# Patient Record
Sex: Male | Born: 1975 | Race: White | Hispanic: No | Marital: Married | Smoking: Never smoker
Health system: Southern US, Academic
[De-identification: ages and names within clinical notes are randomized; demographics above are authoritative.]

## PROBLEM LIST (undated history)

## (undated) DIAGNOSIS — J309 Allergic rhinitis, unspecified: Secondary | ICD-10-CM

## (undated) DIAGNOSIS — I10 Essential (primary) hypertension: Secondary | ICD-10-CM

## (undated) HISTORY — PX: OTHER SURGICAL HISTORY: SHX170

---
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
## 2022-03-05 IMAGING — MR MRI KNEE RT W/O CONTRAST
6 series · 40 of 40 positions shown · non-contrast
Comparison: None available.

﻿EXAM:  [id number]   MRI KNEE RT W/O CONTRAST
INDICATION: Right knee pain.
TECHNIQUE: Noncontrast multiplanar, multisequence MRI was performed.

[Series 4: s-map · axial · right · 7.8mm · 3.91mm/px · z∈[-187,-109]mm · 3 of 11 slices shown]
[im 1/11]
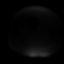
[im 6/11]
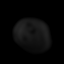
[im 11/11]
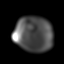

[Series 5: PD fat-sat · axial · right · 4.5mm · 0.37mm/px · z∈[-84,+41]mm · 7 of 26 slices shown (1 of 3)]
[im 1/26]
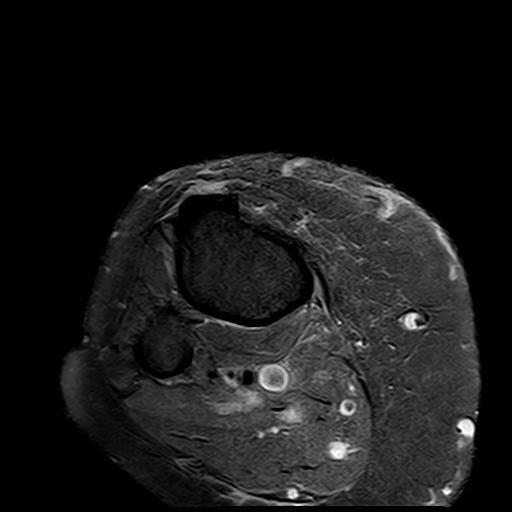
[im 5/26]
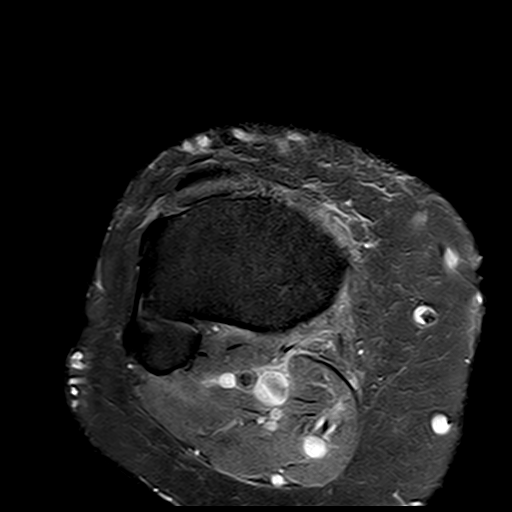
[im 9/26]
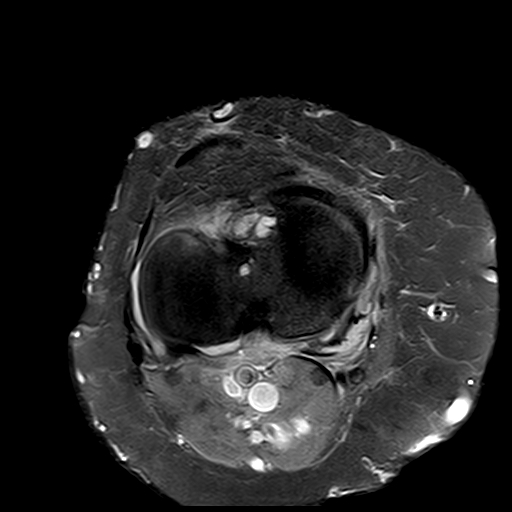
[im 13/26]
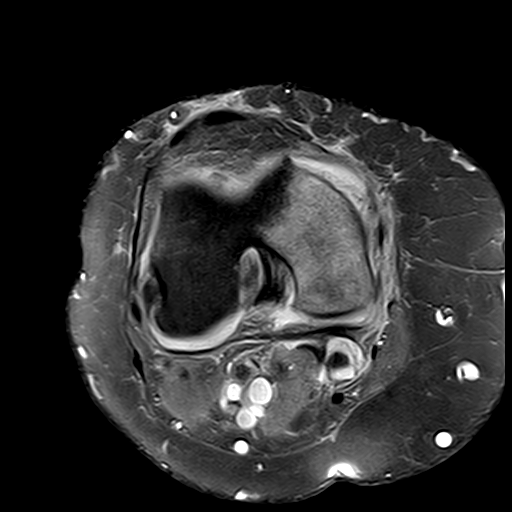
[im 17/26]
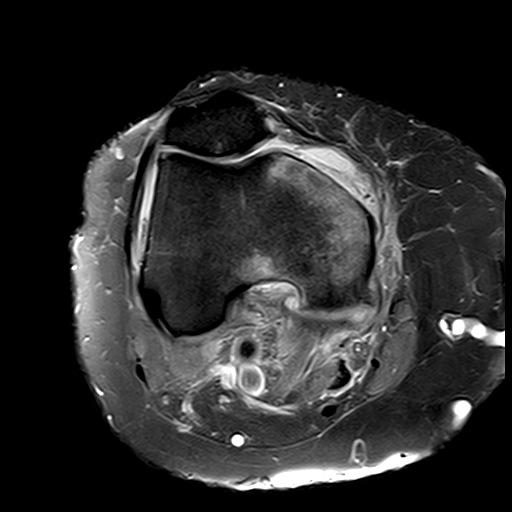
[im 21/26]
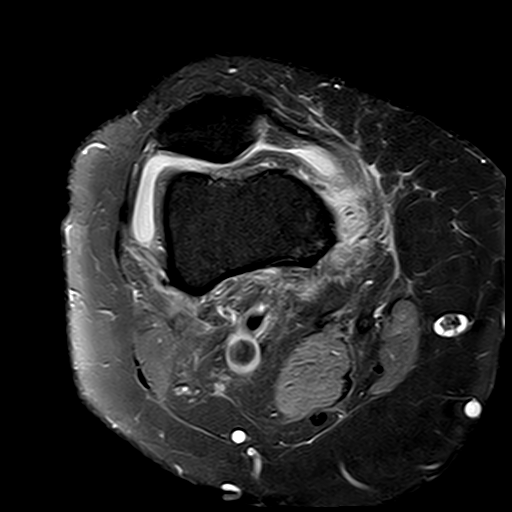
[im 26/26]
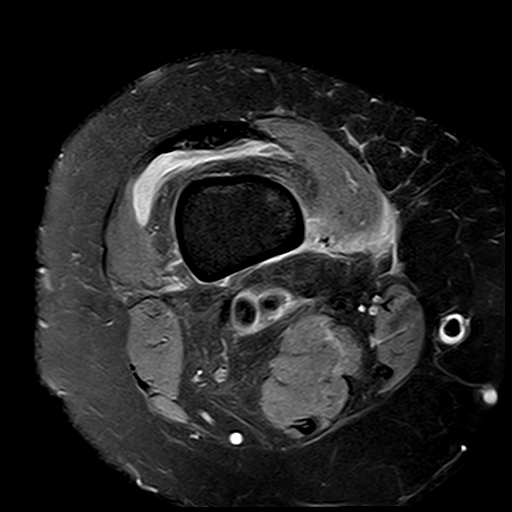

[Series 6: PD fat-sat · sagittal · right · 4.0mm · 0.53mm/px · 7 of 26 slices shown (2 of 3)]
[im 1/26]
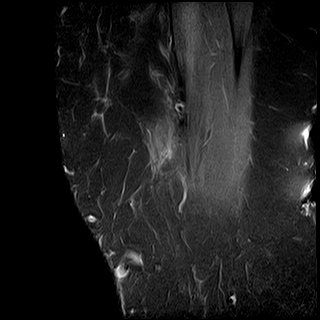
[im 5/26]
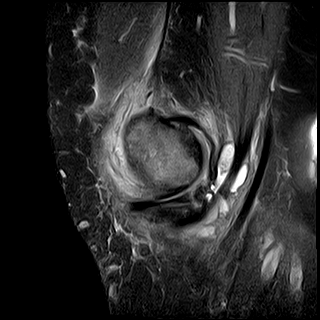
[im 9/26]
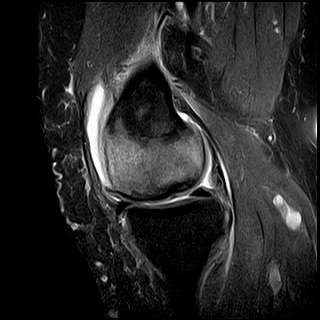
[im 13/26]
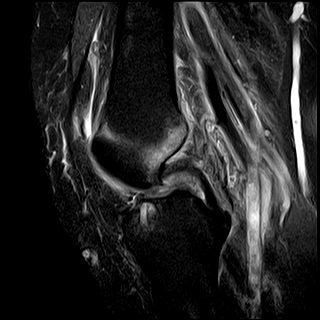
[im 17/26]
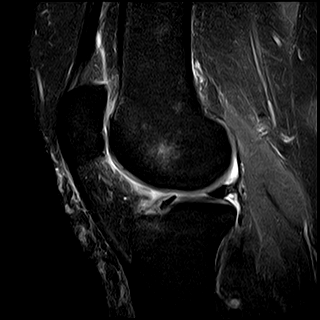
[im 21/26]
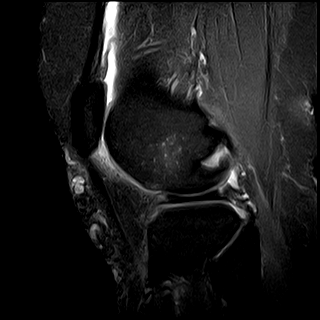
[im 26/26]
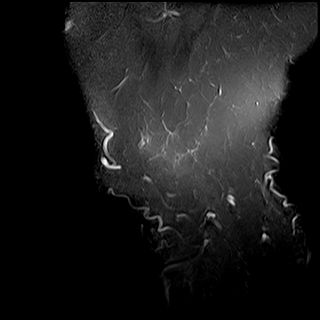

[Series 7: T1 · sagittal · right · 4.0mm · 0.53mm/px · 7 of 26 slices shown]
[im 1/26]
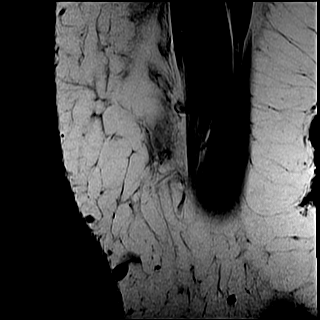
[im 5/26]
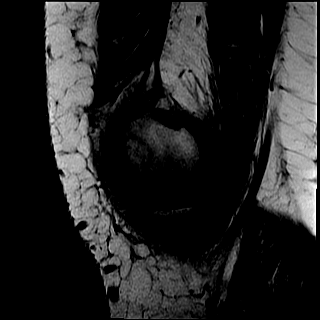
[im 9/26]
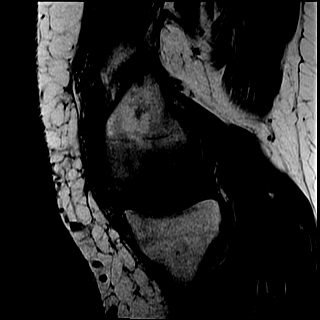
[im 13/26]
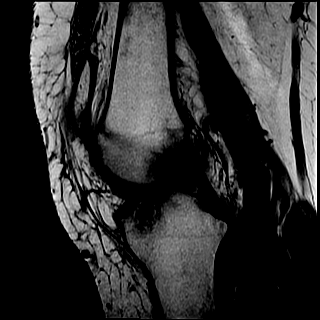
[im 17/26]
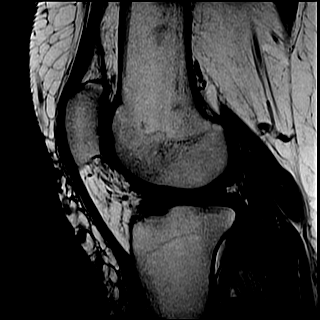
[im 21/26]
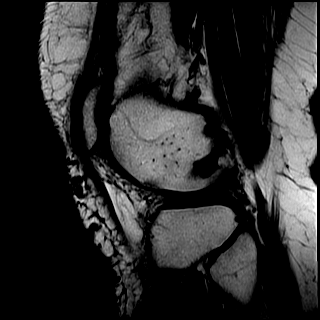
[im 26/26]
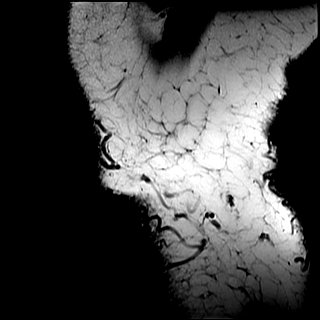

[Series 8: STIR · coronal · right · 4.0mm · 0.62mm/px · 8 of 30 slices shown]
[im 1/30]
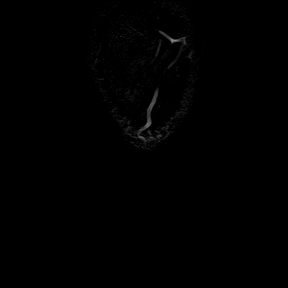
[im 5/30]
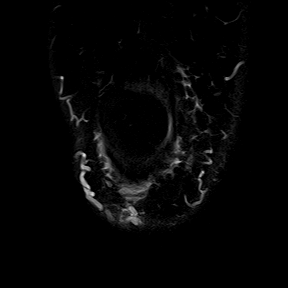
[im 9/30]
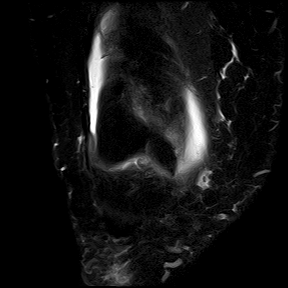
[im 13/30]
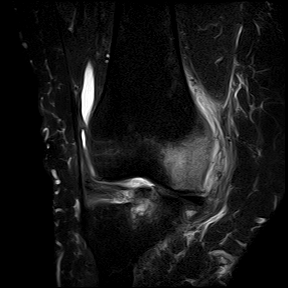
[im 17/30]
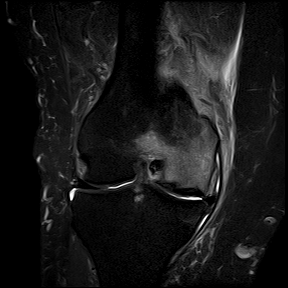
[im 21/30]
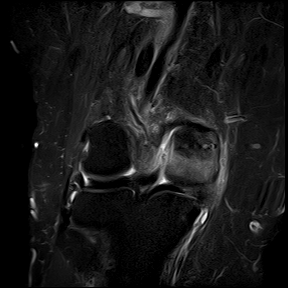
[im 25/30]
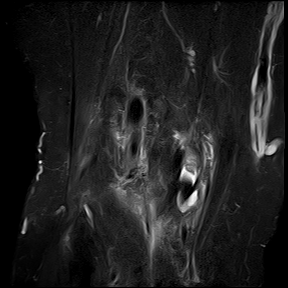
[im 30/30]
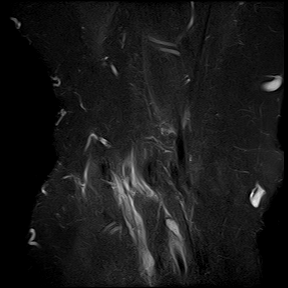

[Series 9: PD fat-sat · coronal · right · 4.0mm · 0.56mm/px · 8 of 30 slices shown (3 of 3)]
[im 1/30]
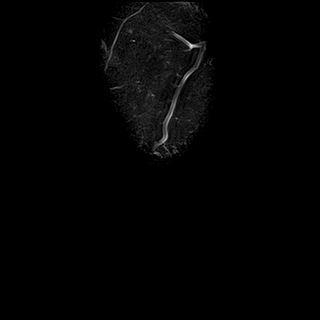
[im 5/30]
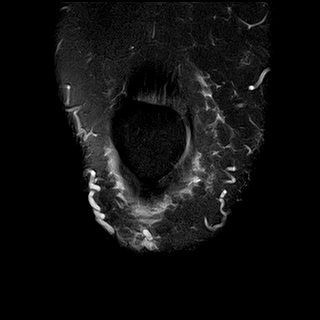
[im 9/30]
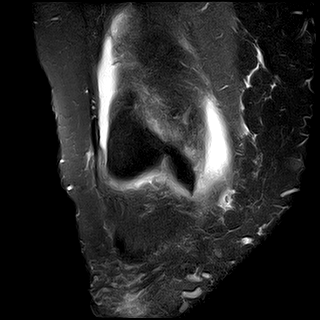
[im 13/30]
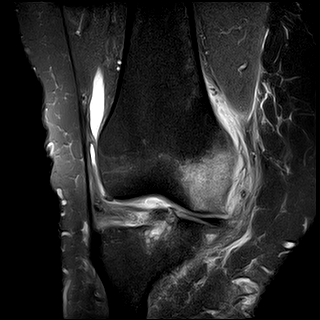
[im 17/30]
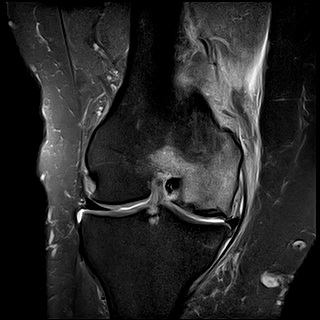
[im 21/30]
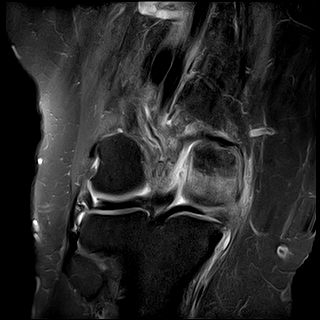
[im 25/30]
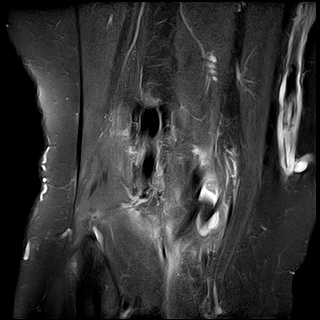
[im 30/30]
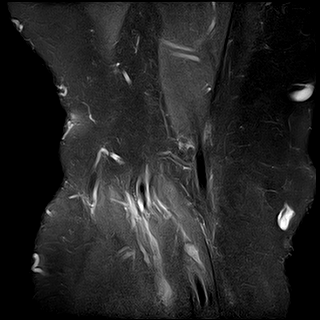

[40 of 40 positions shown; findings below may reference images not displayed]

FINDINGS: There is a small joint effusion.  

There is a large but shallow osteochondral fracture in the medial femoral condyle occupying much of the articular surface.  There is a large area of associated marrow edema in the iliofemoral condyle compatible with a large bone contusion.  

There is a partial tear of the medial collateral ligament with adjacent fluid and edema.  

There is a complex tear involving the posterior horn of the medial meniscus extending to the inferior articular surface.  

The lateral meniscus is intact.  The posterior cruciate ligament appears intact.  The anterior cruciate ligament is probably intact, although a partial tear cannot be entirely excluded.  The lateral collateral ligament and extensor tendons are also intact.  

Multiple cysts are noted in the tibial plateau adjacent to the anterior cruciate ligament insertion.
IMPRESSION: 1. Medial meniscal tear. 

2. Large osteochondral fracture with a large amount of associated marrow edema in the medial femoral condyle.  

3. Partial tear of the medial collateral ligament.

## 2022-04-24 IMAGING — MR MRI KNEE RT W/O CONTRAST
5 series · 36 of 40 positions shown · non-contrast
Comparison: MRI of the right knee dated 03/05/22.

﻿EXAM:  [id number]   MRI KNEE RT W/O CONTRAST
INDICATION: 46-year old male with abnormal findings on previous MRI examination.  Medial right knee pain.  No known history of trauma.  No history of surgery of the knee.
TECHNIQUE: Coronal, sagittal and axial images were obtained using C-spine coil due to body habitus.  Overall quality is acceptable.

[Series 5: PD fat-sat · axial · right · 4.5mm · 0.37mm/px · z∈[-57,+68]mm · 7 of 26 slices shown (1 of 3)]
[im 1/26]
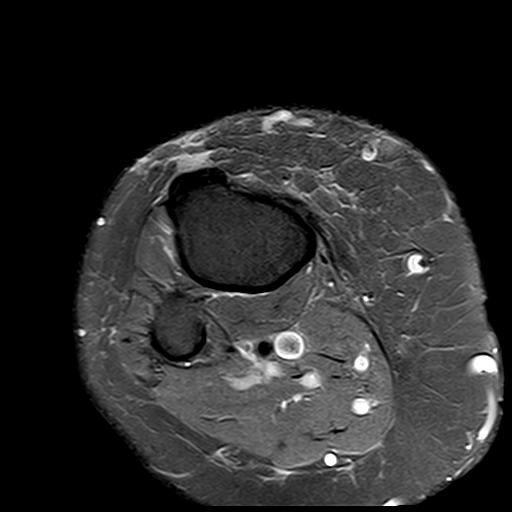
[im 5/26]
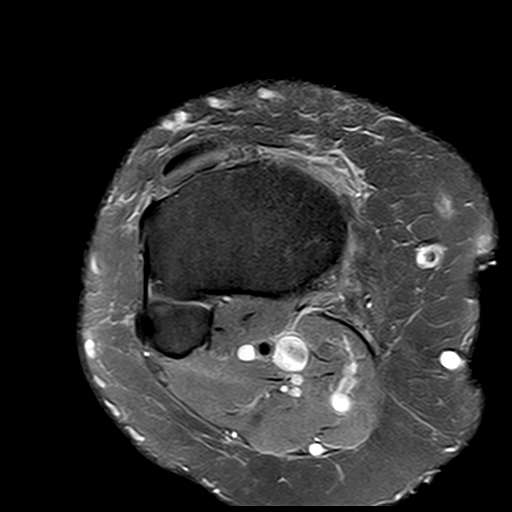
[im 9/26]
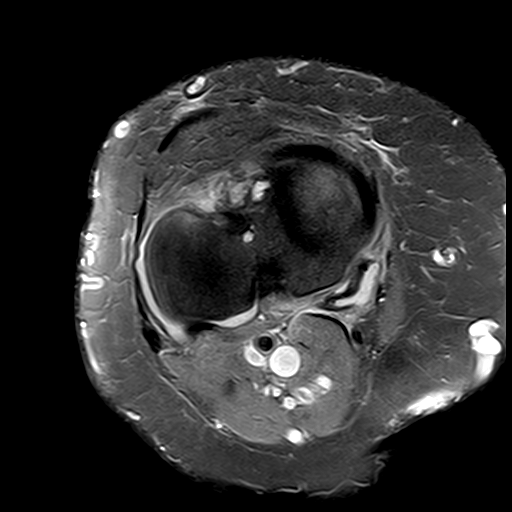
[im 13/26]
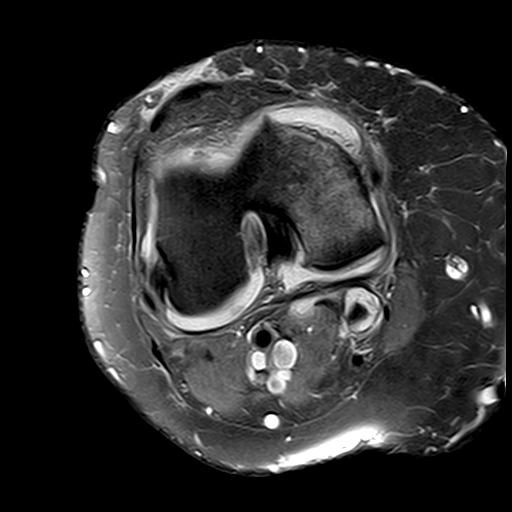
[im 17/26]
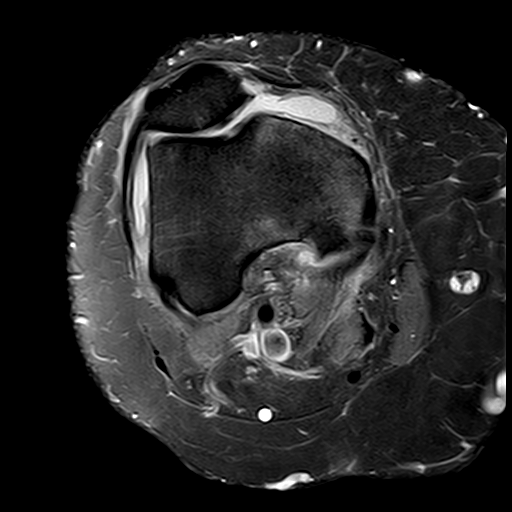
[im 21/26]
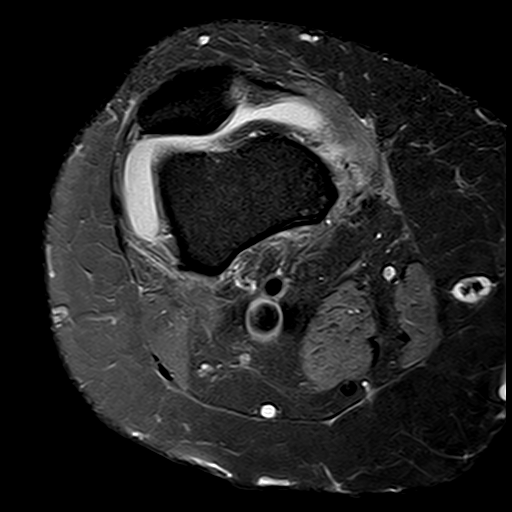
[im 26/26]
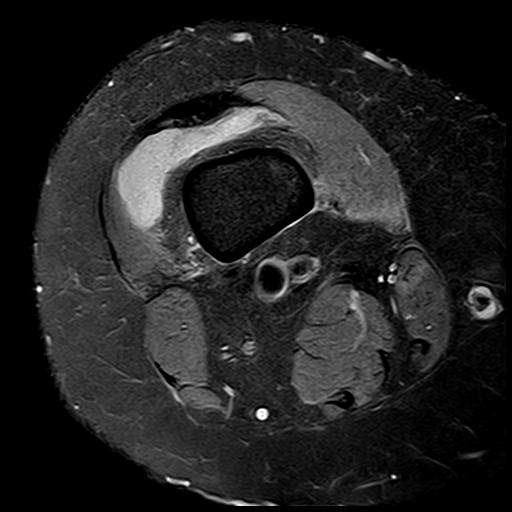

[Series 6: PD fat-sat · sagittal · right · 4.0mm · 0.59mm/px · 7 of 26 slices shown (2 of 3)]
[im 1/26]
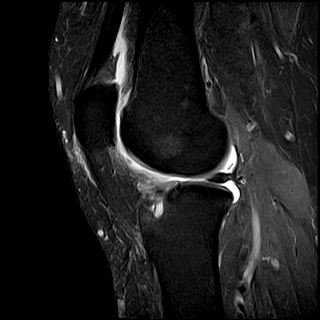
[im 5/26]
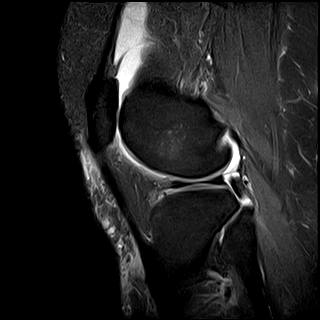
[im 9/26]
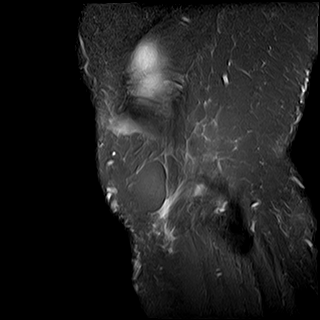
[im 13/26]
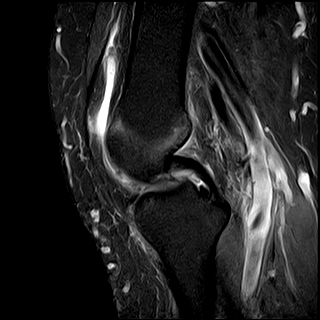
[im 17/26]
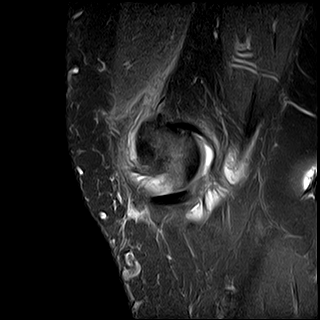
[im 21/26]
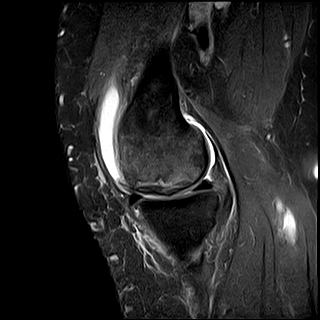
[im 26/26]
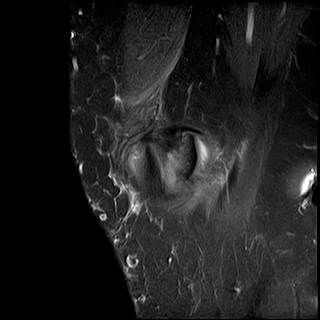

[Series 7: T1 · sagittal · right · 4.0mm · 0.59mm/px · 8 of 26 slices shown]
[im 1/26]
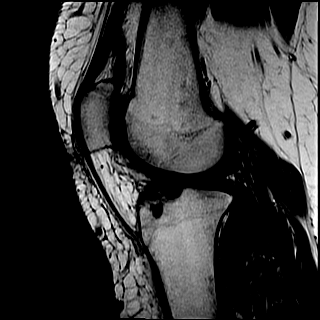
[im 4/26]
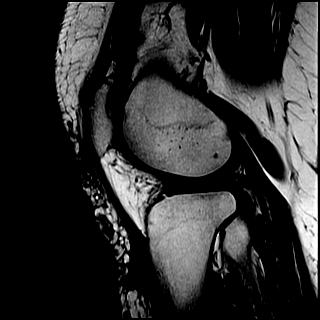
[im 8/26]
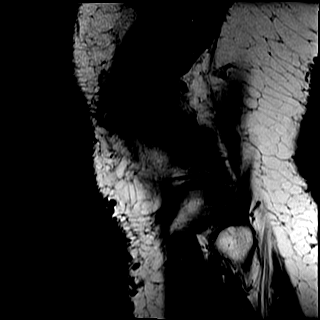
[im 11/26]
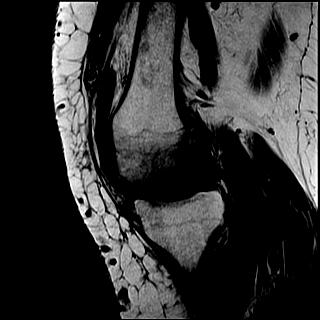
[im 15/26]
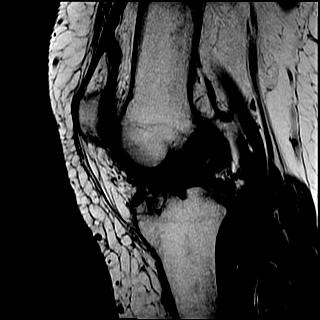
[im 18/26]
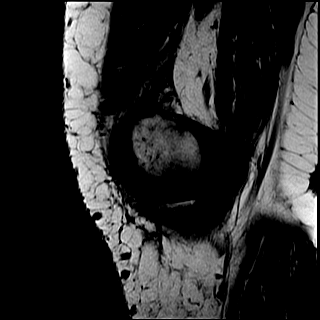
[im 22/26]
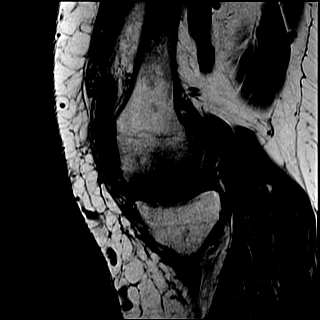
[im 26/26]
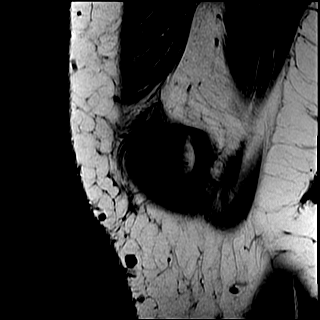

[Series 8: STIR · coronal · right · 4.0mm · 0.69mm/px · 5 of 30 slices shown]
[im 1/30]
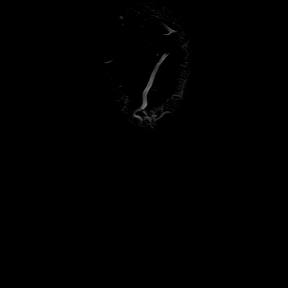
[im 4/30]
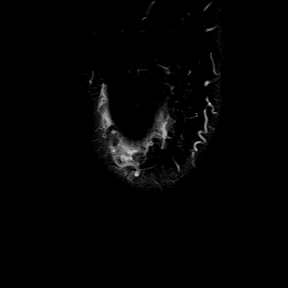
[im 8/30]
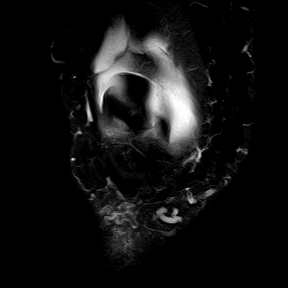
[im 11/30]
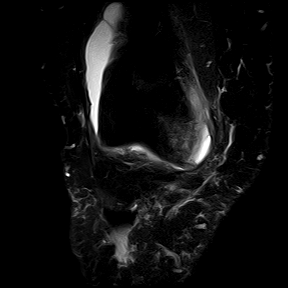
[im 19/30]
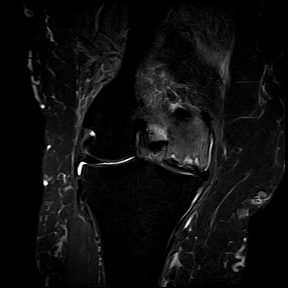

[Series 9: PD fat-sat · coronal · right · 4.0mm · 0.62mm/px · 9 of 30 slices shown (3 of 3)]
[im 1/30]
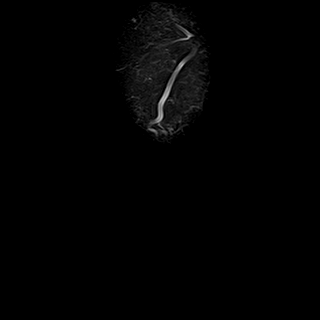
[im 4/30]
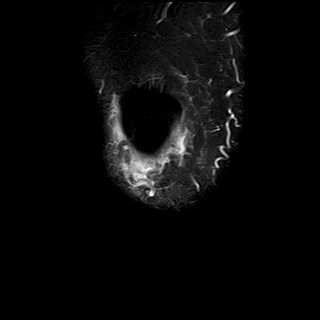
[im 8/30]
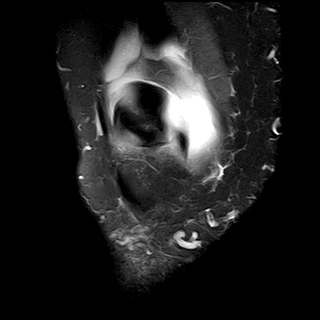
[im 11/30]
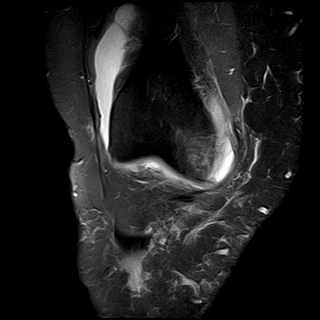
[im 15/30]
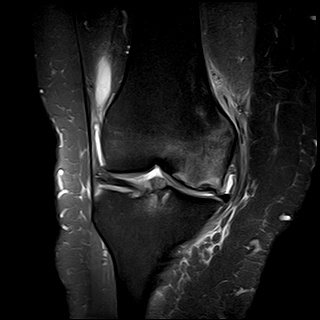
[im 19/30]
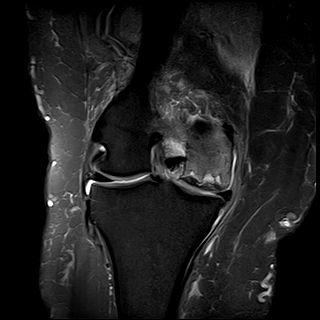
[im 22/30]
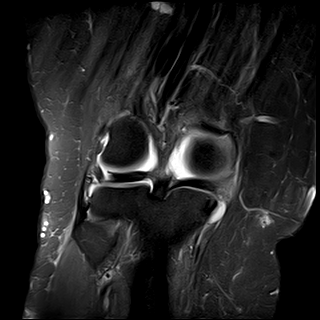
[im 26/30]
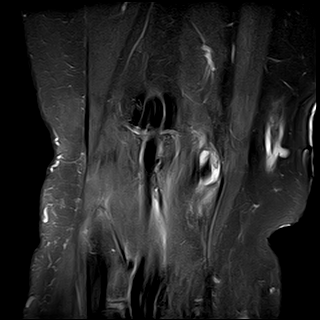
[im 30/30]
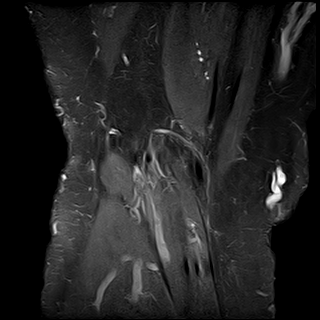

[36 of 40 positions shown; findings below may reference images not displayed]

FINDINGS: Lateral meniscus and lateral articular cartilage are relatively normal as well as lateral collateral ligament.  

Anterior and posterior cruciate ligaments are intact.  

Severe abnormality of medial femoral condyle is noted with severe Grade IV changes of osteochondritis dissecans with significant irregularity of the articular surface and subchondral bone marrow edema of the medial femoral condyle.  This finding is essentially unchanged from 03/05/22.  

Chronic irregular tear of the mid and posterior horn of medial meniscus similar to the previous examination.  Medial extrusion of mid medial meniscus is noted.  Medial collateral ligament is intact.  

Large effusion is noted in the knee joint along with a Baker’s cyst.  Varicose superficial veins are noted.
IMPRESSION: 1. Severe abnormality of medial femoral condyle with large area of osteochondritis dissecans and irregular articular surface of medial femoral condyle with subchondral bone marrow edema.  

2. Chronic irregular tear of posterior horn of medial meniscus with medial extrusion of mid medial meniscus.  

3. Large effusion in the knee joint.  Small Baker’s cyst.  Extensive varicose superficial veins.  

4. Overall findings of right knee are unchanged from 03/05/22.  

Electronically Signed by KNUTSEN, TAQUIZAS at [id number] [DATE]

## 2022-06-06 ENCOUNTER — Other Ambulatory Visit: Payer: BC Managed Care – PPO | Attending: Orthopaedic Surgery

## 2022-06-06 ENCOUNTER — Other Ambulatory Visit (HOSPITAL_COMMUNITY): Payer: Self-pay | Admitting: Orthopaedic Surgery

## 2022-06-06 ENCOUNTER — Inpatient Hospital Stay (HOSPITAL_BASED_OUTPATIENT_CLINIC_OR_DEPARTMENT_OTHER)
Admission: RE | Admit: 2022-06-06 | Discharge: 2022-06-06 | Disposition: A | Discharge status: Home / Self Care | Payer: BC Managed Care – PPO | Source: Ambulatory Visit | Attending: Orthopaedic Surgery | Admitting: Orthopaedic Surgery

## 2022-06-06 ENCOUNTER — Other Ambulatory Visit: Payer: Self-pay

## 2022-06-06 DIAGNOSIS — Z01818 Encounter for other preprocedural examination: Secondary | ICD-10-CM

## 2022-06-06 LAB — CBC WITH DIFF
BASOPHIL #: 0 10*3/uL (ref 0.00–0.10)
BASOPHIL %: 0 % (ref 0–1)
EOSINOPHIL #: 0.1 10*3/uL (ref 0.00–0.50)
EOSINOPHIL %: 1 %
HCT: 44.5 % (ref 36.7–47.1)
HGB: 15.3 g/dL (ref 12.5–16.3)
LYMPHOCYTE #: 2.1 10*3/uL (ref 1.00–3.00)
LYMPHOCYTE %: 28 % (ref 16–44)
MCH: 29.5 pg (ref 23.8–33.4)
MCHC: 34.5 g/dL (ref 32.5–36.3)
MCV: 85.7 fL (ref 73.0–96.2)
MONOCYTE #: 0.6 10*3/uL (ref 0.30–1.00)
MONOCYTE %: 7 % (ref 5–13)
MPV: 6.7 fL — ABNORMAL LOW (ref 7.4–11.4)
NEUTROPHIL #: 4.8 10*3/uL (ref 1.85–7.80)
NEUTROPHIL %: 63 % (ref 43–77)
PLATELETS: 213 10*3/uL (ref 140–440)
RBC: 5.19 10*6/uL (ref 4.06–5.63)
RDW: 13.7 % (ref 12.1–16.2)
WBC: 7.6 10*3/uL (ref 3.6–10.2)

## 2022-06-06 LAB — BASIC METABOLIC PANEL
ANION GAP: 4 mmol/L (ref 4–13)
BUN/CREA RATIO: 14 (ref 6–22)
BUN: 13 mg/dL (ref 7–25)
CALCIUM: 9.4 mg/dL (ref 8.6–10.3)
CHLORIDE: 105 mmol/L (ref 98–107)
CO2 TOTAL: 29 mmol/L (ref 21–31)
CREATININE: 0.95 mg/dL (ref 0.60–1.30)
ESTIMATED GFR: 99 mL/min/{1.73_m2} (ref >59)
GLUCOSE: 132 mg/dL — ABNORMAL HIGH (ref 74–109)
OSMOLALITY, CALCULATED: 278 mOsm/kg (ref 270–290)
POTASSIUM: 3.9 mmol/L (ref 3.5–5.1)
SODIUM: 138 mmol/L (ref 136–145)

## 2022-06-06 LAB — URINALYSIS, MICROSCOPIC
RBCS: <1 /hpf (ref <4)
WBCS: <1 /hpf (ref <6)

## 2022-06-06 LAB — URINALYSIS, MACROSCOPIC
BILIRUBIN: NEGATIVE mg/dL
BLOOD: NEGATIVE mg/dL
GLUCOSE: NEGATIVE mg/dL
KETONES: NEGATIVE mg/dL
LEUKOCYTES: NEGATIVE WBCs/uL
NITRITE: NEGATIVE
PH: 5.5 (ref 5.0–9.0)
PROTEIN: NEGATIVE mg/dL
SPECIFIC GRAVITY: 1.021 (ref 1.002–1.030)
UROBILINOGEN: NORMAL mg/dL

## 2022-06-06 LAB — ECG 12 LEAD
Atrial Rate: 68 {beats}/min
Calculated P Axis: 33 degrees
Calculated R Axis: 262 degrees
Calculated T Axis: 77 degrees
PR Interval: 170 ms
QRS Duration: 96 ms
QT Interval: 374 ms
QTC Calculation: 397 ms
Ventricular rate: 68 {beats}/min

## 2022-06-11 NOTE — H&P (Signed)
Cody Horton   [id number]     CHIEF COMPLAINT  Right Knee pain; 05/06/22; PJB     HISTORY  right knee pain, medial femoral condyle OCD: 47 year old male injured his right knee back in December with twisting type injury. Patient has had persistent pain since that time. He has an MRI which shows large area of the medial femoral condyle OCD with some subchondral fluid as well as a large medial meniscus tear. Patient has been treated with protected weightbearing modification activity. Pain continues. Has intermittent catching and locking in the knee. Recent MRI is reviewed showing large area of OCD medial femoral condyle with some subchondral     PAST MEDICAL HISTORY  Denies history of cancer Cardiac: Hypertension GI history: Ulcers Treatment: N/A By: Musculoskeletal: Denies OA, gout and other common musculoskeletal problems. Rheumatologic: Denies N/A     PAST SURGICAL HISTORY  Date Side/ Site Procedure Details Doctor Facility Complications 03/17/75 Tonsillectomy     ALLERGIES  Name Reactions Notes codeine unknown     MEDICATIONS  Name Tasprin 81 mg 30 oral 1 po daily 2022-03-17 ------Platelet Aggregation Inhibitors - Salicylates IB Pro 800 mg 30 oral 1 po bid 2022-03-17 ------NSAID Analgesics ([facility] Non-Specific) - Propionic Acid Derivatives ------INTERACTIONS: Drug-drug interaction to Tasprin; Micardis 40 mg 30 oral 1 po daily 2022-03-17 ------Angiotensin II Receptor Blockers (ARBs) ------INTERACTIONS: Drug-drug interaction to IB Pro; Strength QTY Sig Date     Family and Social History  Patient is Married. Smoking: Current every day smoker. * pckyr Recode: 1 ETOH: None Lives with family Mother age 49; Father: age 38 1 brothers; No sisters. Diabetes in father, Emelia Loron and Grandmother. Hypertension in mother, father, brother and Grandfather MI in [place] Cholesterol father and Grandfather History of Blood clots in N/A, Grandfather, No known family history of Stroke, Bleeding, Neurologic conditions     REVIEW OF  SYSTEMS  General: No history of change in weight, fever, chills, change in energy, fatigue or changes in sleep habits. Opthalmic: Negative for change in vision / eye problems. ENT: Denies congestion, sinus, throat and hearing issues. Respiratory: Denies SOB, cough and other respiratory symptoms. Cardiac: Denies chest pain, CHF and other cardiac symptoms. GI: No changes in bowel habits, abdominal pain or other GI complaint. Male GU: Denies significant male genitourinary symptoms. Musculoskeletal: Denies significant pain, joint or muscular symptoms. Neurological: Denies cephalgia, seizures, weakness, numbness and other neurologic symptoms. Endocrine: Denies polyuria, polydypsia and other endocrine related complaints. Behavioral: Denies significant mood and psychological symptoms. Hemeatologic: Denies anemia, bleeding / clotting problems and other blood related complaints. Stop Bang interpretation / modified: Low probability of OSA: Score 2 RCRI, no risk factors, risk of cardiac death, nonfatal MI and cardiac arrest, 0.4%. Risk of MI, Pulmonary edema, ventricular fib, cardiac arrest and complete heart block 0.5% POUR / IPSS score 0. Score classified as mild level of symptoms, with no or mild increase in risk of urinary retention. ASA II : Mild systemic disease Charlson score: Enter PMFSH then select calculate score resulting in estimated in hospital mortality rate of 1.2%     EXAM  BMI 45.62; Temp: ; HR ; BP /; H: 6\' 3" ; Wt 365lb pO2 / RA. Ideal body wt below 200.04 lb BMI 30 240.04lb BMI 35 280.05 lb   BMI 39 312.06 lb. Reviewed BMI and local resources for weight management. At BMI level, supervised plan or bariatric management may be needed.  Right knee exam: Small effusion tenderness medial femoral condyle medial joint line McMurray positive. Pedal pulses palpable  no pain with hip rotation. General exam   BMI   Cardiac, respiratory and mental status NORMAL     X-RAYS  Date Name Review 04/24/2022 R Knee MR Medial  meniscus: complex tear. Osteochondritis dessicans medial femoral condyle. Bone edema medial femoral condyle. Large area of OCD, mild joint surface irregularity, fluid signal under 12mm of the OCD lesion., Cysts posterior tibia under tibial spine, 1.3 cm. 1. Severe abnormality of medial femoral condyle with large area of osteochondritis dissecans and irregular articular surface of medial femoral condyle with subchondral bone marrow edema. 2. Chronic irregular tear of posterior horn of medial meniscus with medial extrusion of mid medial meniscus. 3. Large effusion in the knee joint. Small Baker's cyst. Extensive varicose superficial veins. 4. Overall findings of right knee are unchanged from 03/05/22. 03/17/2022 Bil KN XR AP STAND Right knee arthritis: Primary;KLstage3- moderate. Compartments: medial. <50% medial joint space narrowing. 03/05/2022 R Knee MR Medial meniscus: complex tear. Osteochondritis dessicans medial femoral condyle. Bone edema medial femoral condyle. Review of outside study & report     Other Treatments reviewed     ASSESSMENT  osteochondral defect right knee medial meniscal tear Significant bone contusion     Diagnosis  osteochondral defect right knee medial meniscal tear significant bone contusion  PMFSH Diagnosis Z68.42: Body mass index (BMI) 45.0-49.9, adult E66.01: Morbid (severe) obesity due to excess calories     RECOMMENDATION  Treatment: Detailed discussion about OCD and medial meniscus tear. Due to failing to improve, and no change on interval MRI, recommend proceeding with arthroscopy. Arthroscopy will have purpose of treating the medial men iscus tear with excision which may improve some of the symptoms. OCD will be evaluated for stability may consider subchondral bone grafting which was done by either close open technique either using resorbable screws or metal screws which would need to be removed at a later date. If bone graft is required we will typically harvest that locally from  the femoral epicondyles. we discussed the expected postoperative course that in some cases this may require arthroplasty as definitive treatment. We 3  discussed the risks of infection, and spectrum of possible outcomes that the knee will not be normal afterwards and that weight reduction will be critical portion of the treatment in any case. Informed consent is obtained.  Orders:   Date Name Review 04/24/2022 R Knee MR Medial meniscus: complex tear. Osteochondritis dessicans medial femoral condyle. Bone edema medial femoral condyle. Large area of OCD, mild joint surface irregularity, fluid signal under 12mm of the OCD lesion., Cysts posterior tibia under tibial spine, 1.3 cm. 1. Severe abnormality of medial femoral condyle with large area of osteochondritis dissecans and irregular articular surface of medial femoral condyle with subchondral bone marrow edema. 2. Chronic irregular tear of posterior horn of medial meniscus with medial extrusion of mid medial meniscus. 3. Large effusion in the knee joint. Small Baker's cyst. Extensive varicose superficial veins. 4. Overall findings of right knee are unchanged from 03/05/22. 03/17/2022 Bil KN XR AP STAND Right knee arthritis: Primary;KLstage3- moderate. Compartments: medial. <50% medial joint space narrowing. 03/05/2022 R Knee MR Medial meniscus: complex tear. Osteochondritis dessicans medial femoral condyle. Bone edema medial femoral condyle. Review of outside study & report     RETURN / FOLLOW-UP  Right knee arthroscopy scheduled     Alexia Freestone, MD,FAAOS   Attestation:  Patient has been examined before anesthesia, and there are no changes in medical status or indications for surgery.

## 2022-06-12 ENCOUNTER — Inpatient Hospital Stay
Admission: RE | Admit: 2022-06-12 | Discharge: 2022-06-12 | Disposition: A | Discharge status: Home / Self Care | Payer: BC Managed Care – PPO | Attending: Orthopaedic Surgery | Admitting: Orthopaedic Surgery

## 2022-06-12 ENCOUNTER — Encounter (HOSPITAL_COMMUNITY)
Admission: RE | Disposition: A | Discharge status: Home / Self Care | Payer: Self-pay | Source: Home / Self Care | Attending: Orthopaedic Surgery

## 2022-06-12 ENCOUNTER — Ambulatory Visit (HOSPITAL_COMMUNITY): Payer: BC Managed Care – PPO | Admitting: Certified Registered"

## 2022-06-12 ENCOUNTER — Encounter (HOSPITAL_COMMUNITY): Payer: BC Managed Care – PPO | Admitting: Orthopaedic Surgery

## 2022-06-12 ENCOUNTER — Encounter (HOSPITAL_COMMUNITY): Payer: Self-pay | Admitting: Orthopaedic Surgery

## 2022-06-12 ENCOUNTER — Other Ambulatory Visit: Payer: Self-pay

## 2022-06-12 DIAGNOSIS — I10 Essential (primary) hypertension: Secondary | ICD-10-CM | POA: Insufficient documentation

## 2022-06-12 DIAGNOSIS — M93261 Osteochondritis dissecans, right knee: Secondary | ICD-10-CM | POA: Insufficient documentation

## 2022-06-12 DIAGNOSIS — Z6841 Body Mass Index (BMI) 40.0 and over, adult: Secondary | ICD-10-CM | POA: Insufficient documentation

## 2022-06-12 DIAGNOSIS — S83231A Complex tear of medial meniscus, current injury, right knee, initial encounter: Secondary | ICD-10-CM | POA: Insufficient documentation

## 2022-06-12 HISTORY — DX: Allergic rhinitis, unspecified: J30.9

## 2022-06-12 HISTORY — DX: Essential (primary) hypertension: I10

## 2022-06-12 SURGERY — ARTHROSCOPY KNEE WITH MENISCAL REPAIR
Anesthesia: General | Site: Knee | Laterality: Right | Wound class: Clean Wound: Uninfected operative wounds in which no inflammation occurred

## 2022-06-12 MED ORDER — CEFAZOLIN 1 GRAM SOLUTION FOR INJECTION
INTRAMUSCULAR | Status: AC
Start: 2022-06-12 — End: 2022-06-12
  Filled 2022-06-12: qty 10

## 2022-06-12 MED ORDER — SODIUM CHLORIDE 0.9 % (FLUSH) INJECTION SYRINGE
3.0000 mL | INJECTION | Freq: Three times a day (TID) | INTRAMUSCULAR | Status: DC
Start: 2022-06-12 — End: 2022-06-12

## 2022-06-12 MED ORDER — SUCCINYLCHOLINE 20 MG/ML INTRAVENOUS WRAPPER
INJECTION | Freq: Once | INTRAVENOUS | Status: DC | PRN
Start: 2022-06-12 — End: 2022-06-12
  Administered 2022-06-12: 140 mg via INTRAVENOUS

## 2022-06-12 MED ORDER — IPRATROPIUM 0.5 MG-ALBUTEROL 3 MG (2.5 MG BASE)/3 ML NEBULIZATION SOLN
3.0000 mL | INHALATION_SOLUTION | Freq: Once | RESPIRATORY_TRACT | Status: DC | PRN
Start: 2022-06-12 — End: 2022-06-12

## 2022-06-12 MED ORDER — LACTATED RINGERS INTRAVENOUS SOLUTION
INTRAVENOUS | Status: DC
Start: 2022-06-12 — End: 2022-06-12

## 2022-06-12 MED ORDER — MIDAZOLAM 5 MG/ML INJECTION WRAPPER
2.0000 mg | Freq: Once | INTRAMUSCULAR | Status: DC | PRN
Start: 2022-06-12 — End: 2022-06-12
  Administered 2022-06-12: 2 mg via INTRAVENOUS

## 2022-06-12 MED ORDER — HYDROMORPHONE 2 MG/ML INJECTION WRAPPER
0.5000 mg | INJECTION | Freq: Once | INTRAMUSCULAR | Status: DC | PRN
Start: 2022-06-12 — End: 2022-06-12

## 2022-06-12 MED ORDER — DEXAMETHASONE SODIUM PHOSPHATE 4 MG/ML INJECTION SOLUTION
INTRAMUSCULAR | Status: AC
Start: 2022-06-12 — End: 2022-06-12
  Filled 2022-06-12: qty 1

## 2022-06-12 MED ORDER — ONDANSETRON HCL (PF) 4 MG/2 ML INJECTION SOLUTION
INTRAMUSCULAR | Status: AC
Start: 2022-06-12 — End: 2022-06-12
  Filled 2022-06-12: qty 2

## 2022-06-12 MED ORDER — ALBUTEROL SULFATE 2.5 MG/3 ML (0.083 %) SOLUTION FOR NEBULIZATION
2.5000 mg | INHALATION_SOLUTION | Freq: Once | RESPIRATORY_TRACT | Status: DC | PRN
Start: 2022-06-12 — End: 2022-06-12

## 2022-06-12 MED ORDER — LIDOCAINE (PF) 100 MG/5 ML (2 %) INTRAVENOUS SYRINGE
INJECTION | Freq: Once | INTRAVENOUS | Status: DC | PRN
Start: 2022-06-12 — End: 2022-06-12
  Administered 2022-06-12: 100 mg via INTRAVENOUS

## 2022-06-12 MED ORDER — DEXAMETHASONE SODIUM PHOSPHATE 4 MG/ML INJECTION SOLUTION
4.0000 mg | Freq: Once | INTRAMUSCULAR | Status: AC
Start: 2022-06-12 — End: 2022-06-12
  Administered 2022-06-12: 4 mg via INTRAVENOUS

## 2022-06-12 MED ORDER — PROCHLORPERAZINE EDISYLATE 10 MG/2 ML (5 MG/ML) INJECTION SOLUTION
5.0000 mg | Freq: Once | INTRAMUSCULAR | Status: DC | PRN
Start: 2022-06-12 — End: 2022-06-12

## 2022-06-12 MED ORDER — ONDANSETRON HCL (PF) 4 MG/2 ML INJECTION SOLUTION
4.0000 mg | Freq: Once | INTRAMUSCULAR | Status: AC
Start: 2022-06-12 — End: 2022-06-12
  Administered 2022-06-12: 4 mg via INTRAVENOUS

## 2022-06-12 MED ORDER — SUGAMMADEX 100 MG/ML INTRAVENOUS SOLUTION
Freq: Once | INTRAVENOUS | Status: DC | PRN
Start: 2022-06-12 — End: 2022-06-12
  Administered 2022-06-12: 500 mg via INTRAVENOUS

## 2022-06-12 MED ORDER — FENTANYL (PF) 50 MCG/ML INJECTION SOLUTION
INTRAMUSCULAR | Status: AC
Start: 2022-06-12 — End: 2022-06-12
  Filled 2022-06-12: qty 2

## 2022-06-12 MED ORDER — MORPHINE 10 MG/ML INJECTION WRAPPER
INTRAVENOUS | Status: AC
Start: 2022-06-12 — End: 2022-06-12
  Filled 2022-06-12: qty 1

## 2022-06-12 MED ORDER — PROPOFOL 10 MG/ML IV BOLUS
INJECTION | Freq: Once | INTRAVENOUS | Status: DC | PRN
Start: 2022-06-12 — End: 2022-06-12
  Administered 2022-06-12: 200 mg via INTRAVENOUS

## 2022-06-12 MED ORDER — SODIUM CHLORIDE 0.9 % (FLUSH) INJECTION SYRINGE
3.0000 mL | INJECTION | INTRAMUSCULAR | Status: DC | PRN
Start: 2022-06-12 — End: 2022-06-12

## 2022-06-12 MED ORDER — MIDAZOLAM 5 MG/ML INJECTION WRAPPER
INTRAMUSCULAR | Status: AC
Start: 2022-06-12 — End: 2022-06-12
  Filled 2022-06-12: qty 1

## 2022-06-12 MED ORDER — ONDANSETRON HCL (PF) 4 MG/2 ML INJECTION SOLUTION
4.0000 mg | Freq: Once | INTRAMUSCULAR | Status: DC | PRN
Start: 2022-06-12 — End: 2022-06-12

## 2022-06-12 MED ORDER — SODIUM CHLORIDE 0.9 % INTRAVENOUS PIGGYBACK
INJECTION | INTRAVENOUS | Status: AC
Start: 2022-06-12 — End: 2022-06-12
  Filled 2022-06-12: qty 50

## 2022-06-12 MED ORDER — FENTANYL (PF) 50 MCG/ML INJECTION WRAPPER
50.0000 ug | INJECTION | INTRAMUSCULAR | Status: DC | PRN
Start: 2022-06-12 — End: 2022-06-12
  Administered 2022-06-12 (×2): 50 ug via INTRAVENOUS

## 2022-06-12 MED ORDER — FAMOTIDINE (PF) 20 MG/2 ML INTRAVENOUS SOLUTION
INTRAVENOUS | Status: AC
Start: 2022-06-12 — End: 2022-06-12
  Filled 2022-06-12: qty 2

## 2022-06-12 MED ORDER — HYDROCODONE 7.5 MG-ACETAMINOPHEN 325 MG TABLET
1.0000 | ORAL_TABLET | Freq: Four times a day (QID) | ORAL | 0 refills | Status: AC | PRN
Start: 2022-06-12 — End: ?

## 2022-06-12 MED ORDER — ROPIVACAINE (PF) 2 MG/ML (0.2 %) INJECTION SOLUTION
INTRAMUSCULAR | Status: AC
Start: 2022-06-12 — End: 2022-06-12
  Filled 2022-06-12: qty 10

## 2022-06-12 MED ORDER — ROCURONIUM 10 MG/ML INTRAVENOUS SOLUTION
Freq: Once | INTRAVENOUS | Status: DC | PRN
Start: 2022-06-12 — End: 2022-06-12
  Administered 2022-06-12: 5 mg via INTRAVENOUS
  Administered 2022-06-12: 45 mg via INTRAVENOUS

## 2022-06-12 MED ORDER — ROPIVACAINE (PF) 2 MG/ML (0.2 %) INJECTION SOLUTION
Freq: Once | INTRAMUSCULAR | Status: DC | PRN
Start: 2022-06-12 — End: 2022-06-12

## 2022-06-12 MED ORDER — FAMOTIDINE (PF) 20 MG/2 ML INTRAVENOUS SOLUTION
20.0000 mg | Freq: Once | INTRAVENOUS | Status: AC
Start: 2022-06-12 — End: 2022-06-12
  Administered 2022-06-12: 20 mg via INTRAVENOUS

## 2022-06-12 MED ORDER — CEFAZOLIN 1 GRAM SOLUTION FOR INJECTION
Freq: Once | INTRAMUSCULAR | Status: DC | PRN
Start: 2022-06-12 — End: 2022-06-12
  Administered 2022-06-12: 3000 mg via INTRAVENOUS

## 2022-06-12 MED ORDER — FENTANYL (PF) 50 MCG/ML INJECTION WRAPPER
INJECTION | Freq: Once | INTRAMUSCULAR | Status: DC | PRN
Start: 2022-06-12 — End: 2022-06-12
  Administered 2022-06-12 (×2): 50 ug via INTRAVENOUS

## 2022-06-12 MED ORDER — OXYCODONE-ACETAMINOPHEN 5 MG-325 MG TABLET
1.0000 | ORAL_TABLET | Freq: Once | ORAL | Status: DC | PRN
Start: 2022-06-12 — End: 2022-06-12
  Administered 2022-06-12: 1 via ORAL
  Filled 2022-06-12: qty 1

## 2022-06-12 MED ORDER — LACTATED RINGERS INTRAVENOUS SOLUTION
INTRAVENOUS | Status: DC | PRN
Start: 2022-06-12 — End: 2022-06-12
  Administered 2022-06-12: 0 via INTRAVENOUS

## 2022-06-12 SURGICAL SUPPLY — 70 items
BANDAGE ELT 5.8YDX4IN NONST SLFCLS ELAS KNIT TEAL END STCH VELCRO COTTON POLY BLND STD LGTH COMPRESS (WOUND CARE SUPPLY) ×1 IMPLANT
BANDAGE ELT 5.8YDX6IN NONST SLFCLS ELAS KNIT STRCH VELCRO COTTON POLY BLND STD LGTH COMPRESS BGE HNY (WOUND CARE SUPPLY) ×1 IMPLANT
BANDAGE ESMARK 9FTX6IN STRL SYN COMPRESS LF (WOUND CARE SUPPLY) ×1 IMPLANT
BLADE 10 2 END CBNSTL SURG STRL DISP (SURGICAL CUTTING SUPPLIES) ×3 IMPLANT
BLADE 10MM SURG STRL PAR GRAFT KNIFE (SURGICAL CUTTING SUPPLIES) ×1 IMPLANT
BLADE 11 2 END CBNSTL SURG STRL DISP (SURGICAL CUTTING SUPPLIES) ×1 IMPLANT
BLADE 15 2 END CBNSTL SURG STRL DISP (SURGICAL CUTTING SUPPLIES) ×1 IMPLANT
BLADE SAW 24.5X9MM SGTL SS THK.64MM XSH NRW THN STRL LF (SURGICAL CUTTING SUPPLIES) ×1 IMPLANT
BLADE SAW 73X25MM 2 CUT SGTL SS THK.89MM MED W STRL LF (SURGICAL CUTTING SUPPLIES) ×1 IMPLANT
BLADE SHAVER 13CM 4MM COOLCUT 2 CUT POWER SFT TISS RESCT STRL DISP (ENDOSCOPIC SUPPLIES) ×1 IMPLANT
BLADE SURG CLPR W 37.2MM GP EXIST HNDL GTT IN CHRG .23MM NONST LF  DISP (MED SURG SUPPLIES) IMPLANT
BURR SHAVER 13CM 4MM COOLCUT 8 FLUTE OVAL STRL DISP (ENDOSCOPIC SUPPLIES) IMPLANT
BURR SHAVER 13CM 4MM COOLCUT 8 FLUTE RND STRL DISP (ENDOSCOPIC SUPPLIES) ×1 IMPLANT
CONV USE ITEM 338662 - PACK SURG ASCP STRL DISP ~~LOC~~ BPT MED CNTR LF (CUSTOM TRAYS & PACK) ×1 IMPLANT
COUNTER 20 CNT BLOCK ADH NEEDLE STRL LF  RD SHARP FOAM 15.75X11.5X14IN DISP (MED SURG SUPPLIES) ×1 IMPLANT
COVER 53X24IN MAYOSTAND PRXM STRL DISP EQP SMS LF (DRAPE/PACKS/SHEETS/OR TOWEL) ×1 IMPLANT
COVER TBL 90X50IN STD SMS REINF FNFLD STRL LF  DISP (DRAPE/PACKS/SHEETS/OR TOWEL) ×2 IMPLANT
CUFF TOURNIQUET PURP 34X4IN COLOR CUF CYL 2 PORT 1 BLADDER QC 40IN STRL LF  DISP (MED SURG SUPPLIES) IMPLANT
CUFF TOURNIQUET RD 18X4IN COLOR CUF CYL 2 PORT BLADDER QC LOW PROF STRL LF  DISP (MED SURG SUPPLIES) IMPLANT
CUFF TOURNIQUET RYL BLU 30X4IN COLOR CUF CYL 2 PORT 1 BLADDER QC LOW PROF 40IN STRL LF  DISP (MED SURG SUPPLIES) IMPLANT
DETERGENT INSTR 22OZ TRNSPT GEL RINSE FREE NEUT PH PREKLENZ CLR PLSNT LF (MISCELLANEOUS PT CARE ITEMS) ×1 IMPLANT
DEVICE FIX TIGHTROPE 10MM 4 PNTLK ADJ LOOP DRILL H BONE TEND BONE SM ACL ×1 IMPLANT
DRAPE ASCP ABS REINF FENESTRATE FL CNTRL PCH 121X90IN EXTREMITY STD LF  STRL DISP SURG SMS 37X29IN (DRAPE/PACKS/SHEETS/OR TOWEL) ×1 IMPLANT
DRAPE INCS ANTIMIC 23X23IN IOBN2 TRNSPR (DRAPE/PACKS/SHEETS/OR TOWEL) ×1 IMPLANT
DRILL SURG FLIPCUTTER III (SURGICAL CUTTING SUPPLIES) ×1 IMPLANT
GLOVE SURG 6 LF  PF BEAD CUF STRL CRM 11.3IN PROTEXIS PLISPRN THK9.1 MIL (GLOVES AND ACCESSORIES) IMPLANT
GLOVE SURG 6 LF  PF SMOOTH BEAD CUF INTLK STRL BLU 11.3IN PROTEXIS NEU-THERA PLISPRN THK7.9 MIL (GLOVES AND ACCESSORIES) IMPLANT
GLOVE SURG 6.5 LF  PF BEAD CUF STRL CRM 11.3IN PROTEXIS PI PLISPRN THK9.1 MIL (GLOVES AND ACCESSORIES) IMPLANT
GLOVE SURG 6.5 LF  PF SMOOTH BEAD CUF INTLK STRL BLU 11.3IN PROTEXIS NEU-THERA PLISPRN THK7.9 MIL (GLOVES AND ACCESSORIES) IMPLANT
GLOVE SURG 6.5 LTX PF SMOOTH BEAD CUF STRL YW 11.5IN PROTEXIS NEU-THERA DDRGL THK8.7 MIL (GLOVES AND ACCESSORIES) IMPLANT
GLOVE SURG 7 LF  PF BEAD CUF STRL CRM 11.8IN PROTEXIS PI PLISPRN THK9.1 MIL (GLOVES AND ACCESSORIES) IMPLANT
GLOVE SURG 7 LF  PF SMOOTH BEAD CUF INTLK STRL BLU 11.8IN PROTEXIS NEU-THERA PLISPRN THK7.9 MIL (GLOVES AND ACCESSORIES) IMPLANT
GLOVE SURG 7 LTX PF SMOOTH BEAD CUF STRL YW 12IN PROTEXIS NEU-THERA DDRGL THK8.7 MIL (GLOVES AND ACCESSORIES) IMPLANT
GLOVE SURG 7.5 LF  PF BEAD CUF STRL CRM 11.8IN PROTEXIS PI PLISPRN THK9.1 MIL (GLOVES AND ACCESSORIES) IMPLANT
GLOVE SURG 7.5 LF  PF SMOOTH BEAD CUF INTLK STRL BLU 11.8IN PROTEXIS NEU-THERA PLISPRN THK7.9 MIL (GLOVES AND ACCESSORIES) IMPLANT
GLOVE SURG 7.5 LTX PF SMOOTH BEAD CUF STRL YW 12IN PROTEXIS (GLOVES AND ACCESSORIES) IMPLANT
GLOVE SURG 8 LF  PF BEAD CUF STRL CRM 11.8IN PROTEXIS PI PLISPRN THK9.1 MIL (GLOVES AND ACCESSORIES) IMPLANT
GLOVE SURG 8 LF  PF SMOOTH BEAD CUF INTLK STRL BLU 11.8IN PROTEXIS NEU-THERA PLISPRN THK7.9 MIL (GLOVES AND ACCESSORIES) IMPLANT
GLOVE SURG 8 LTX PF SMOOTH BEAD CUF STRL YW 12IN PROTEXIS NEU-THERA DDRGL THK8.7 MIL (GLOVES AND ACCESSORIES) ×1 IMPLANT
GLOVE SURG 8.5 LF  PF BEAD CUF STRL CRM 11.8IN PROTEXIS PI PLISPRN THK9.1 MIL (GLOVES AND ACCESSORIES) IMPLANT
GOWN SURG XL XLNG L4 REINF HKLP CLSR SET IN SLEEVE STRL LF  DISP BLU SIRUS SMS PE 56IN (DRAPE/PACKS/SHEETS/OR TOWEL) ×3 IMPLANT
GRAFT SFT TISS 10MM FLEXIGRAFT DWL PATEL LGMNT TEND ALLOGRAFT PREFORM (IMPLANTS GRAFT/TISSUE) ×1
KIT INSTR ACL TRNTB DISP (ORTHOPEDICS (NOT IMPLANTS)) ×1 IMPLANT
LABEL MED CORRECT MED LABELING SYS 4 FLG 2 SHEET 24 PRPRNT STRL (MED SURG SUPPLIES) ×1 IMPLANT
MAT INSTR TRY 44X36IN WTPRF BACKSHEET TPNX BLU (MISCELLANEOUS PT CARE ITEMS) ×1 IMPLANT
NEEDLE HYPO  18GA 1.5IN REG WL BD PRCSNGL POLYPROP REG BVL LL HUB CLR CD DEHP-FR STRL LF  DISP (MED SURG SUPPLIES) ×1 IMPLANT
NEEDLE SPINAL PNK 3.5IN 18GA QUINCKE REG WL POLYPROP QUINCKE TIP STRL LF  DISP (MED SURG SUPPLIES) ×1 IMPLANT
PACK SURG ASCP STRL DISP ~~LOC~~ BPT MED CNTR LF (CUSTOM TRAYS & PACK) ×1
PUMP TUBING 13FT CONT WV III DUALWAVE ARTHRO STRL DISP (MED SURG SUPPLIES) ×1 IMPLANT
SCREW INTFR FASTTHREAD 8MM 20MM BIOCOMP KNEE ×1 IMPLANT
SOL IRRG 0.9% NACL 1000ML PLASTIC PR BTL ISTNC N-PYRG STRL LF (MEDICATIONS/SOLUTIONS) ×1 IMPLANT
SOL IRRG LR 3L PRSV N-PYRG FLXB CONTAINR STRL LF (MEDICATIONS/SOLUTIONS) ×5 IMPLANT
SOL SURG PREP 26ML DRPRP 74% ISPRP 0.7% IOD POVACRYLEX SLF CNTN APPL SKIN STRL PREOP (MED SURG SUPPLIES) ×1 IMPLANT
SPONGE GAUZE 4X4IN MDCHC COTTON 12 PLY TY 7 LF  STRL DISP (WOUND CARE SUPPLY) ×1 IMPLANT
SPONGE LAP 18X18IN PREWASH RIGID TRY STRL LF  WHT (MED SURG SUPPLIES) ×1 IMPLANT
SPONGE SURG 4X4IN 16 PLY XRY DETECT COTTON STRL LF  DISP (WOUND CARE SUPPLY) IMPLANT
STRIP 4X.5IN STRSTRP PLSTR REINF SKNCLS WHT STRL LF (WOUND CARE SUPPLY) ×1 IMPLANT
SUTURE 1 GS-22 POLYSRB 30IN VIOL BRD COAT ABS (SUTURE/WOUND CLOSURE) ×2 IMPLANT
SUTURE 2 FIBERSTICK TIGERSTICK 50IN BLU 1 END STIFFEN BRD MONOF TIE NONAB 12IN (SUTURE/WOUND CLOSURE) ×1 IMPLANT
SUTURE 2-0 X-1 VICRYL 27IN UNDYED BRD COAT ABS (SUTURE/WOUND CLOSURE) ×2 IMPLANT
SUTURE 3-0 PS2 MONOCRYL MTPS 27IN UNDYED MONOF ABS (SUTURE/WOUND CLOSURE) ×1 IMPLANT
SUTURE 3-0 SH VICRYL 27IN VIOL BRD COAT ABS (SUTURE/WOUND CLOSURE) ×1 IMPLANT
SUTURE 4-0 FS1 PROLENE 18IN BLU MONOF NONAB (SUTURE/WOUND CLOSURE) ×1 IMPLANT
SUTURE 5 V-37 ETHIBOND 30IN GRN BRD 4 STRN NONAB (SUTURE/WOUND CLOSURE) ×1 IMPLANT
SWAB BD BBL BD CLTSWB LIQUID STRT MED RYN TMPR EVD SEAL 2 ACT CAP RND BTM TUBE 5.25IN STRL CULT LF (SPECIMEN COLLECTION SUPPLIES) ×1 IMPLANT
SYRINGE LL 10ML LF  STRL GRAD N-PYRG DEHP-FR PVC FREE MED DISP (MED SURG SUPPLIES) ×1 IMPLANT
TOWEL 24X16IN COTTON BLU DISP SURG STRL LF (DRAPE/PACKS/SHEETS/OR TOWEL) ×2 IMPLANT
TUBING IRRG 6FT DUALWAVE BKFL CK VALVE EXT STRL DISP (ENDOSCOPIC SUPPLIES) ×1 IMPLANT
TUBING SUCT CLR 12FT .25IN ARGYLE PVC NCDTV STR MALE FEMALE MLD CONN STRL LF (MED SURG SUPPLIES) ×1 IMPLANT
TUBING SUCT CLR 6FT .25IN ARGYLE PVC NCDTV STR MALE FEMALE MLD CONN STRL LF (MED SURG SUPPLIES) ×1 IMPLANT

## 2022-06-12 NOTE — Discharge Instructions (Addendum)
Follow up with Herbert Seta at Dr. Lennox Pippins office on Wednesday, June 25, 2022 at 1:35 pm.

## 2022-06-12 NOTE — OR Surgeon (Signed)
[facility]  Operative Note     PATIENT NAME:  Cody Horton, Cody Horton.  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   Procedure Performed: Procedure(s) (LRB):  DIAGNOSTIC ARTHROSCOPY WITH REPAIR VERSUS EXCISION MEDIAL MENISCUS WITH POSSIBLE BONE GRAFT RIGHT KNEE (Right)   Implants:* No implants in log *   Surgeon: Alexia Freestone, MD   Anesthesia: Anesthesiologist: Cornett, Mayme Genta, MD  CRNA: Lauro Regulus, CRNA general    OR Staff: Circulator: Octavio Graves, RN  PERIOPERATIVE CARE ASSISTANT: Catha Brow, PCA; Avel Peace, PCA  Relief Circulator: Vickki Muff, RN  Scrub Person: Almyra Free, ST  Scrub First Assist: Doreene Adas, CST   Anticoagulation: ASA     Antibiotics:  KEfzol  Estimated Blood Loss: * No blood loss amount entered *   Specimens: * No specimens in log *   Complications: None immediate  Findings:  Medial meniscus:  Complex tear inner 1/3 of posterior 1/2  Lateral meniscus:  Minor free border fibrillation    Articular Cartilage:    Site Outerbridge Grade Size and Notes   Patella: 3 Central    Trochlea:   3-4 Central, superolateral   Medial femur 3, OCD 16mm uncontained necrosis posterior central LFC, unstable.   Medial Tibia 3 Central    Lateral femur 3 3x66mm area   Lateral tibia 1 central     Grading Notes:  Only abnormal recorded.   0: normal (no entry)   1: Softening;    2: Fissures or minor fragmentation;   3: partial thickness loss, "crab meat" appearance;   4:  Cartilage destruction with exposed subchondral bone    Ligaments:    MCL:    Nl    LCL       Nl    ACL      NL    PCL      NL  Synovial:  Effusion:  Loose bodies: NA  Patellar Tracking: NL    Indications For Procedure:  Cody Horton.   is a very pleasant  47 y.o. male  presenting  for OSTEOCHONDRITIS DESSICANS MEDIAL FEMORAL CONDYLE AND MEDIAL MENISCUS TEAR RIGHT KNEE Risks, benefits, indications, and complications of procedure were discussed, and informed signed consent obtained.  Description of procedure:  Side and site verified.  Time out completed with entire operating room staff.  Satisfactory anesthesia induced.  Patient carefully positioned with operative leg in leg holder and opposite leg in well leg support.  All bone prominences padded and protected.  Sterile preparation and draping completed.  Diagnostic arthroscopy completed with above findings.    Debridement / synovectomy:  Debridement / synovectomy: Working through medial and lateral parapatellar portals, all hypertrophic synovium and cartilage debris removed using a shaver and hand instruments.   Medial femoral condyle area of AVN was unstable with fragmented cartilage, blending into areas of grade 3-4 chondromalacia.  Multiple small fragments of bone removed.  Fragmented cartilage debrided to stable border.  Central defect smoothed with shaver.  Small bone loose bodies in popliteal recess, and 1 behind cruciates revoved with shaver.   Medial meniscus excision:  Medial meniscus excision: Working through medial and lateral parapatellar portals, all unstable meniscus tissue was excised using basket forceps and shaver.  The remnant was palpated  with a probe to insure resection to a stable border.  At completion of the procedure, irrigation was drained from the knee.  Ropivicaine was injected.  Stab incisions were closed with interrupted sutures and dressings applied.  The patient was taken to recovery under the supervision of anesthesia.

## 2022-06-12 NOTE — Anesthesia Preprocedure Evaluation (Signed)
ANESTHESIA PRE-OP EVALUATION  Planned Procedure: DIAGNOSTIC ARTHROSCOPY WITH REPAIR VERSUS EXCISION MEDIAL MENISCUS WITH POSSIBLE BONE GRAFT RIGHT KNEE (Right: Knee)  Review of Systems     anesthesia history negative               Pulmonary  negative pulmonary ROS,    Cardiovascular    Hypertension and ECG reviewed , Exercise Tolerance: > or = 4 METS        GI/Hepatic/Renal   negative GI/hepatic/renal ROS,         Endo/Other    morbid obesity,      Neuro/Psych/MS   negative neuro/psych ROS,      Cancer                        Physical Assessment      Airway       Mallampati: II                  Dental           (+) poor dentition           Pulmonary    Breath sounds clear to auscultation       Cardiovascular    Rhythm: regular  Rate: Normal       Other findings              Plan  ASA 2     Planned anesthesia type: general                             Anesthesia issues/risks discussed are: Stroke, Aspiration, Sore Throat, Cardiac Events/MI, Nerve Injuries and Dental Injuries.  Anesthetic plan and risks discussed with patient  signed consent obtained          Patient's NPO status is appropriate for Anesthesia.

## 2022-06-12 NOTE — Anesthesia Postprocedure Evaluation (Signed)
Anesthesia Post Op Evaluation    Patient: Cody Horton.  Procedure(s):  DIAGNOSTIC ARTHROSCOPY WITH PARTIAL MENISCECTOMY MEDIAL MENISCUS    Last Vitals:Temperature: 36.1 C (97 F) (06/12/22 1245)  Heart Rate: 66 (06/12/22 1245)  BP (Non-Invasive): 134/82 (06/12/22 1245)  Respiratory Rate: 13 (06/12/22 1245)  SpO2: 98 % (06/12/22 1245)    No notable events documented.    Patient is sufficiently recovered from the effects of anesthesia to participate in the evaluation and has returned to their pre-procedure level.  Patient location during evaluation: PACU       Patient participation: complete - patient participated  Level of consciousness: awake and alert and responsive to verbal stimuli    Pain management: adequate  Airway patency: patent    Anesthetic complications: no  Cardiovascular status: acceptable  Respiratory status: acceptable  Hydration status: acceptable  Patient post-procedure temperature: Pt Normothermic   PONV Status: Absent

## 2022-06-12 NOTE — Anesthesia Transfer of Care (Signed)
ANESTHESIA TRANSFER OF CARE   Cody Schliep [name]. is a 47 y.o. ,male, Weight: (!) 168 kg (370 lb)   had Procedure(s):  DIAGNOSTIC ARTHROSCOPY WITH PARTIAL MENISCECTOMY MEDIAL MENISCUS  performed  06/12/22   Primary Service: Alexia Freestone, MD    Past Medical History:   Diagnosis Date   . Allergic rhinitis    . Hypertension       Allergy History as of 06/12/22       CODEINE         Noted Status Severity Type Reaction    06/12/22 0715 Antonieta Loveless, LPN 13/08/65 Active High  Anaphylaxis, Mental Status Effect    Comments: HALLUCINATIONS     06/12/22 0713 Antonieta Loveless, LPN 78/46/96 Active   Mental Status Effect    Comments: HALLUCINATIONS                   I completed my transfer of care / handoff to the receiving personnel during which we discussed:  Access, Airway, All key/critical aspects of case discussed, Analgesia, Antibiotics, Expectation of post procedure, Fluids/Product, Gave opportunity for questions and acknowledgement of understanding, Labs and PMHx      Post Location: PACU                                                           Last OR Temp: Temperature: 36.3 C (97.3 F)  ABG:  POTASSIUM   Date Value Ref Range Status   06/06/2022 3.9 3.5 - 5.1 mmol/L Final     KETONES   Date Value Ref Range Status   06/06/2022 Negative Negative, Trace mg/dL Final     CALCIUM   Date Value Ref Range Status   06/06/2022 9.4 8.6 - 10.3 mg/dL Final     Calculated P Axis   Date Value Ref Range Status   06/06/2022 33 degrees Final     Calculated R Axis   Date Value Ref Range Status   06/06/2022 262 degrees Final     Calculated T Axis   Date Value Ref Range Status   06/06/2022 77 degrees Final     Airway:* No LDAs found *  Blood pressure 120/74, pulse 88, temperature 36.3 C (97.3 F), resp. rate 18, height 1.88 m (6\' 2" ), weight (!) 168 kg (370 lb), SpO2 91%.
